# Patient Record
Sex: Female | Born: 1955 | Race: Asian | Hispanic: No | Marital: Married | Smoking: Never smoker
Health system: Southern US, Community
[De-identification: ages and names within clinical notes are randomized; demographics above are authoritative.]

## PROBLEM LIST (undated history)

## (undated) DIAGNOSIS — R87810 Cervical high risk human papillomavirus (HPV) DNA test positive: Secondary | ICD-10-CM

## (undated) DIAGNOSIS — H409 Unspecified glaucoma: Secondary | ICD-10-CM

## (undated) HISTORY — DX: Unspecified glaucoma: H40.9

## (undated) HISTORY — DX: Cervical high risk human papillomavirus (HPV) DNA test positive: R87.810

---

## 2004-04-12 HISTORY — PX: OVARIAN CYST REMOVAL: SHX89

## 2014-11-20 ENCOUNTER — Other Ambulatory Visit (HOSPITAL_COMMUNITY): Payer: Self-pay | Admitting: Physician Assistant

## 2014-11-20 DIAGNOSIS — Z139 Encounter for screening, unspecified: Secondary | ICD-10-CM

## 2014-11-22 HISTORY — PX: GLAUCOMA SURGERY: SHX656

## 2014-11-27 ENCOUNTER — Ambulatory Visit (HOSPITAL_COMMUNITY): Payer: Self-pay

## 2014-12-31 ENCOUNTER — Encounter: Payer: Self-pay | Admitting: *Deleted

## 2015-01-13 ENCOUNTER — Other Ambulatory Visit (HOSPITAL_COMMUNITY)
Admission: RE | Admit: 2015-01-13 | Discharge: 2015-01-13 | Disposition: A | Discharge status: Home / Self Care | Payer: 59 | Source: Ambulatory Visit | Attending: Adult Health | Admitting: Adult Health

## 2015-01-13 ENCOUNTER — Ambulatory Visit (INDEPENDENT_AMBULATORY_CARE_PROVIDER_SITE_OTHER): Payer: 59 | Admitting: Adult Health

## 2015-01-13 ENCOUNTER — Encounter: Payer: Self-pay | Admitting: Adult Health

## 2015-01-13 VITALS — BP 140/90 | HR 80 | Ht 61.75 in | Wt 127.0 lb

## 2015-01-13 DIAGNOSIS — Z01419 Encounter for gynecological examination (general) (routine) without abnormal findings: Secondary | ICD-10-CM | POA: Diagnosis not present

## 2015-01-13 DIAGNOSIS — Z1151 Encounter for screening for human papillomavirus (HPV): Secondary | ICD-10-CM | POA: Diagnosis present

## 2015-01-13 DIAGNOSIS — Z1212 Encounter for screening for malignant neoplasm of rectum: Secondary | ICD-10-CM

## 2015-01-13 LAB — HEMOCCULT GUIAC POC 1CARD (OFFICE): FECAL OCCULT BLD: NEGATIVE

## 2015-01-13 NOTE — Progress Notes (Signed)
Patient ID: Kathleen White, female   DOB: 02/20/1956, 59 y.o.   MRN: [id number] History of Present Illness:  Kathleen White is a 59 year old Asian female, married in for a well woman gyn exam and pap.She is new pt. PCP is [facility]. 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  Current Medications, Allergies, Past Medical History, Past Surgical History, Family History and Social History were reviewed in [employer] record.     Review of Systems: Patient denies any  Daily headaches(has some headaches, has glaucoma), hearing loss, fatigue, blurred vision, shortness of breath, chest pain, abdominal pain, problems with bowel movements, urination, or intercourse(dry at times). No joint pain or mood swings.    Physical Exam:BP 140/90 mmHg  Pulse 80  Ht 5' 1.75" (1.568 m)  Wt 127 lb (57.607 kg)  BMI 23.43 kg/m2 General:  Well developed, well nourished, no acute distress Skin:  Warm and dry Neck:  Midline trachea, normal thyroid, good ROM, no lymphadenopathy Lungs; Clear to auscultation bilaterally Breast:  No dominant palpable mass, retraction, or nipple discharge Cardiovascular: Regular rate and rhythm Abdomen:  Soft, non tender, no hepatosplenomegaly Pelvic:  External genitalia is normal in appearance, no lesions.  The vagina has decreased color, moisture and rugae. Urethra has no lesions or masses. The cervix is atrophic, pap with HPV performed.  Uterus is felt to be normal size, shape, and contour.  No adnexal masses or tenderness noted.Bladder is non tender, no masses felt. Rectal: Good sphincter tone, no polyps, or hemorrhoids felt.  Hemoccult negative. Extremities/musculoskeletal:  No swelling or varicosities noted, no clubbing or cyanosis Psych:  No mood changes, alert and cooperative,seems happy   Impression: Well woman gyn exam and pap    Plan: Mammogram scheduled for her 10/17 at 7:20 am at [facility] Physical in 1  year Pap in 3 if normal Colonoscopy per GI Labs with PCP Use astroglide with sex

## 2015-01-13 NOTE — Patient Instructions (Addendum)
Physical in  1 year Pap in 3 years if normal with negative HPV Mammogram yearly  10/17 at 7:20 am at [facility] with PCP Colonoscopy per GI

## 2015-01-14 LAB — CYTOLOGY - PAP

## 2015-01-15 ENCOUNTER — Telehealth: Payer: Self-pay | Admitting: Adult Health

## 2015-01-15 NOTE — Telephone Encounter (Signed)
Left message to call about pap

## 2015-01-20 ENCOUNTER — Telehealth: Payer: Self-pay | Admitting: Adult Health

## 2015-01-20 ENCOUNTER — Encounter: Payer: Self-pay | Admitting: Adult Health

## 2015-01-20 DIAGNOSIS — R87810 Cervical high risk human papillomavirus (HPV) DNA test positive: Secondary | ICD-10-CM

## 2015-01-20 HISTORY — DX: Cervical high risk human papillomavirus (HPV) DNA test positive: R87.810

## 2015-01-20 NOTE — Telephone Encounter (Signed)
Left message to call about pap

## 2015-01-22 ENCOUNTER — Ambulatory Visit (HOSPITAL_COMMUNITY): Payer: Self-pay

## 2015-01-23 ENCOUNTER — Encounter: Payer: Self-pay | Admitting: Adult Health

## 2015-01-27 ENCOUNTER — Ambulatory Visit (HOSPITAL_COMMUNITY): Payer: Self-pay

## 2015-01-27 ENCOUNTER — Other Ambulatory Visit (HOSPITAL_COMMUNITY): Payer: Self-pay | Admitting: Physician Assistant

## 2015-01-27 ENCOUNTER — Ambulatory Visit (HOSPITAL_COMMUNITY)
Admission: RE | Admit: 2015-01-27 | Discharge: 2015-01-27 | Disposition: A | Discharge status: Home / Self Care | Payer: 59 | Source: Ambulatory Visit | Attending: Physician Assistant | Admitting: Physician Assistant

## 2015-01-27 DIAGNOSIS — Z1231 Encounter for screening mammogram for malignant neoplasm of breast: Secondary | ICD-10-CM | POA: Insufficient documentation

## 2016-03-22 ENCOUNTER — Ambulatory Visit (INDEPENDENT_AMBULATORY_CARE_PROVIDER_SITE_OTHER): Payer: BLUE CROSS/BLUE SHIELD | Admitting: Otolaryngology

## 2016-03-22 DIAGNOSIS — J351 Hypertrophy of tonsils: Secondary | ICD-10-CM

## 2016-03-22 DIAGNOSIS — J3501 Chronic tonsillitis: Secondary | ICD-10-CM | POA: Diagnosis not present

## 2020-03-05 ENCOUNTER — Other Ambulatory Visit (HOSPITAL_COMMUNITY): Payer: Self-pay | Admitting: Physician Assistant

## 2020-03-05 DIAGNOSIS — Z1231 Encounter for screening mammogram for malignant neoplasm of breast: Secondary | ICD-10-CM

## 2020-03-05 DIAGNOSIS — E2839 Other primary ovarian failure: Secondary | ICD-10-CM

## 2020-07-07 ENCOUNTER — Ambulatory Visit (HOSPITAL_COMMUNITY)
Admission: RE | Admit: 2020-07-07 | Discharge: 2020-07-07 | Disposition: A | Discharge status: Home / Self Care | Payer: 59 | Source: Ambulatory Visit | Attending: Physician Assistant | Admitting: Physician Assistant

## 2020-07-07 DIAGNOSIS — E2839 Other primary ovarian failure: Secondary | ICD-10-CM

## 2020-07-07 DIAGNOSIS — Z1231 Encounter for screening mammogram for malignant neoplasm of breast: Secondary | ICD-10-CM | POA: Insufficient documentation

## 2020-11-06 ENCOUNTER — Encounter (INDEPENDENT_AMBULATORY_CARE_PROVIDER_SITE_OTHER): Payer: Self-pay | Admitting: *Deleted

## 2020-12-23 ENCOUNTER — Other Ambulatory Visit (INDEPENDENT_AMBULATORY_CARE_PROVIDER_SITE_OTHER): Payer: Self-pay

## 2020-12-23 ENCOUNTER — Encounter (INDEPENDENT_AMBULATORY_CARE_PROVIDER_SITE_OTHER): Payer: Self-pay

## 2020-12-23 ENCOUNTER — Telehealth (INDEPENDENT_AMBULATORY_CARE_PROVIDER_SITE_OTHER): Payer: Self-pay

## 2020-12-23 ENCOUNTER — Encounter (INDEPENDENT_AMBULATORY_CARE_PROVIDER_SITE_OTHER): Payer: Self-pay | Admitting: *Deleted

## 2020-12-23 DIAGNOSIS — Z1211 Encounter for screening for malignant neoplasm of colon: Secondary | ICD-10-CM

## 2020-12-23 MED ORDER — PEG 3350-KCL-NA BICARB-NACL 420 G PO SOLR: 4000.0000 mL | Freq: Once | ORAL | 0 refills | Status: AC

## 2020-12-23 NOTE — Telephone Encounter (Signed)
Referring MD/PCP: Collene Mares  Procedure: Screening Tcs  Reason/Indication:  Screening  Has patient had this procedure before?  Yes, 2009  If so, when, by whom and where?    Is there a family history of colon cancer?  No  Who?  What age when diagnosed?    Is patient diabetic? If yes, Type 1 or Type 2   No      Does patient have prosthetic heart valve or mechanical valve?  No  Do you have a pacemaker/defibrillator?  No  Has patient ever had endocarditis/atrial fibrillation? No  Does patient use oxygen? No  Has patient had joint replacement within last 12 months?  No  Is patient constipated or do they take laxatives? No  Does patient have a history of alcohol/drug use?  No  Have you had a stroke/heart attack last 6 mths? No  Do you take medicine for weight loss?  No  For female patients,: do you still have your menstrual cycle? No  Is patient on blood thinner such as Coumadin, Plavix and/or Aspirin? No  Medications: Otc eye drops   Allergies: Nkda  Medication Adjustment per Dr Rehman/Dr Jenetta Downer None  Procedure date & time: 02/04/2021 at 9:15 am

## 2020-12-23 NOTE — Telephone Encounter (Signed)
Donette Larry, CMA

## 2021-01-08 NOTE — Telephone Encounter (Signed)
Ok to schedule.  Thanks,  Sharmeka Palmisano Castaneda Mayorga, MD Gastroenterology and Hepatology [facility]  

## 2021-02-04 ENCOUNTER — Ambulatory Visit (HOSPITAL_COMMUNITY): Payer: 59 | Admitting: Anesthesiology

## 2021-02-04 ENCOUNTER — Ambulatory Visit (HOSPITAL_COMMUNITY)
Admission: RE | Admit: 2021-02-04 | Discharge: 2021-02-04 | Disposition: A | Discharge status: Home / Self Care | Payer: 59 | Attending: Gastroenterology | Admitting: Gastroenterology

## 2021-02-04 ENCOUNTER — Encounter (HOSPITAL_COMMUNITY)
Admission: RE | Disposition: A | Discharge status: Home / Self Care | Payer: Self-pay | Source: Home / Self Care | Attending: Gastroenterology

## 2021-02-04 ENCOUNTER — Encounter (HOSPITAL_COMMUNITY): Payer: Self-pay | Admitting: Gastroenterology

## 2021-02-04 ENCOUNTER — Other Ambulatory Visit: Payer: Self-pay

## 2021-02-04 DIAGNOSIS — D122 Benign neoplasm of ascending colon: Secondary | ICD-10-CM | POA: Insufficient documentation

## 2021-02-04 DIAGNOSIS — K5730 Diverticulosis of large intestine without perforation or abscess without bleeding: Secondary | ICD-10-CM | POA: Insufficient documentation

## 2021-02-04 DIAGNOSIS — Z1211 Encounter for screening for malignant neoplasm of colon: Secondary | ICD-10-CM | POA: Insufficient documentation

## 2021-02-04 HISTORY — PX: COLONOSCOPY WITH PROPOFOL: SHX5780

## 2021-02-04 HISTORY — PX: POLYPECTOMY: SHX5525

## 2021-02-04 LAB — HM COLONOSCOPY

## 2021-02-04 SURGERY — COLONOSCOPY WITH PROPOFOL
Anesthesia: General

## 2021-02-04 MED ORDER — PHENYLEPHRINE 40 MCG/ML (10ML) SYRINGE FOR IV PUSH (FOR BLOOD PRESSURE SUPPORT)
PREFILLED_SYRINGE | INTRAVENOUS | Status: DC | PRN
  Administered 2021-02-04: 40 ug via INTRAVENOUS
  Administered 2021-02-04: 80 ug via INTRAVENOUS

## 2021-02-04 MED ORDER — PHENYLEPHRINE 40 MCG/ML (10ML) SYRINGE FOR IV PUSH (FOR BLOOD PRESSURE SUPPORT)
PREFILLED_SYRINGE | INTRAVENOUS | Status: AC
  Filled 2021-02-04: qty 10

## 2021-02-04 MED ORDER — LIDOCAINE HCL (CARDIAC) PF 100 MG/5ML IV SOSY
PREFILLED_SYRINGE | INTRAVENOUS | Status: DC | PRN
Start: 2021-02-04 — End: 2021-02-04
  Administered 2021-02-04: 50 mg via INTRAVENOUS

## 2021-02-04 MED ORDER — PROPOFOL 500 MG/50ML IV EMUL
INTRAVENOUS | Status: DC | PRN
  Administered 2021-02-04: 150 ug/kg/min via INTRAVENOUS

## 2021-02-04 MED ORDER — PROPOFOL 10 MG/ML IV BOLUS
INTRAVENOUS | Status: DC | PRN
  Administered 2021-02-04: 100 mg via INTRAVENOUS

## 2021-02-04 MED ORDER — LACTATED RINGERS IV SOLN: INTRAVENOUS | Status: DC

## 2021-02-04 MED ORDER — STERILE WATER FOR IRRIGATION IR SOLN
Status: DC | PRN
  Administered 2021-02-04: 60 mL

## 2021-02-04 NOTE — Anesthesia Preprocedure Evaluation (Signed)
Anesthesia Evaluation  Patient identified by MRN, date of birth, ID band Patient awake    Reviewed: Allergy & Precautions, NPO status , Patient's Chart, lab work & pertinent test results  Airway Mallampati: II  TM Distance: >3 FB Neck ROM: Full    Dental  (+) Dental Advisory Given, Missing   Pulmonary neg pulmonary ROS,    Pulmonary exam normal breath sounds clear to auscultation       Cardiovascular negative cardio ROS Normal cardiovascular exam Rhythm:Regular Rate:Normal     Neuro/Psych negative neurological ROS  negative psych ROS   GI/Hepatic negative GI ROS, Neg liver ROS,   Endo/Other  negative endocrine ROS  Renal/GU negative Renal ROS  negative genitourinary   Musculoskeletal negative musculoskeletal ROS (+)   Abdominal   Peds negative pediatric ROS (+)  Hematology negative hematology ROS (+)   Anesthesia Other Findings   Reproductive/Obstetrics negative OB ROS                             Anesthesia Physical Anesthesia Plan  ASA: 1  Anesthesia Plan: General   Post-op Pain Management:    Induction: Intravenous  PONV Risk Score and Plan: TIVA  Airway Management Planned: Nasal Cannula and Natural Airway  Additional Equipment:   Intra-op Plan:   Post-operative Plan:   Informed Consent: I have reviewed the patients History and Physical, chart, labs and discussed the procedure including the risks, benefits and alternatives for the proposed anesthesia with the patient or authorized representative who has indicated his/her understanding and acceptance.     Dental advisory given  Plan Discussed with: CRNA and Surgeon  Anesthesia Plan Comments:         Anesthesia Quick Evaluation

## 2021-02-04 NOTE — H&P (Signed)
Kathleen White is an 65 y.o. female.   Chief Complaint: Screening for colon cancer HPI: 65 year old female with past medical history of glaucoma, coming for screening colonoscopy.  Last colonoscopy was performed 10 years ago per the patient, she reports it was normal, no report is available.  The patient denies having any complaints such as melena, hematochezia, abdominal pain or distention, change in her bowel movement consistency or frequency, no changes in her weight recently.  No family history of colorectal cancer.   Past Medical History:  Diagnosis Date   Glaucoma    Papanicolaou smear of cervix with positive high risk human papilloma virus (HPV) test 01/20/2015    Past Surgical History:  Procedure Laterality Date   GLAUCOMA SURGERY  11/22/14   OVARIAN CYST REMOVAL  2006    Family History  Problem Relation Age of Onset   Hypertension Mother    Heart attack Mother    Heart attack Maternal Aunt    Social History:  reports that she has never smoked. She has never used smokeless tobacco. She reports that she does not drink alcohol and does not use drugs.  Allergies: No Known Allergies  Medications Prior to Admission  Medication Sig Dispense Refill   acetaminophen (TYLENOL) 500 MG tablet Take 1,000 mg by mouth daily as needed for headache.     calcium carbonate (OS-CAL) 600 MG TABS tablet Take 600 mg by mouth daily with breakfast.     latanoprost (XALATAN) 0.005 % ophthalmic solution Place 1 drop into both eyes at bedtime.     Omega-3 1400 MG CAPS Take 1,400 mg by mouth daily.      No results found for this or any previous visit (from the past 48 hour(s)). No results found.  Review of Systems  Constitutional: Negative.   HENT: Negative.    Eyes: Negative.   Respiratory: Negative.    Cardiovascular: Negative.   Gastrointestinal: Negative.   Endocrine: Negative.   Genitourinary: Negative.   Musculoskeletal: Negative.   Skin: Negative.   Allergic/Immunologic: Negative.    Neurological: Negative.   Hematological: Negative.   Psychiatric/Behavioral: Negative.     Blood pressure 123/70, pulse 99, temperature 98.2 F (36.8 C), temperature source Oral, resp. rate 17, height 5\' 2"  (1.575 m), weight 59 kg, SpO2 99 %. Physical Exam  GENERAL: The patient is AO x3, in no acute distress. HEENT: Head is normocephalic and atraumatic. EOMI are intact. Mouth is well hydrated and without lesions. NECK: Supple. No masses LUNGS: Clear to auscultation. No presence of rhonchi/wheezing/rales. Adequate chest expansion HEART: RRR, normal s1 and s2. ABDOMEN: Soft, nontender, no guarding, no peritoneal signs, and nondistended. BS +. No masses. EXTREMITIES: Without any cyanosis, clubbing, rash, lesions or edema. NEUROLOGIC: AOx3, no focal motor deficit. SKIN: no jaundice, no rashes  Assessment/Plan 65 year old female with past medical history of glaucoma, coming for screening colonoscopy. The patient is at average risk for colorectal cancer.  We will proceed with colonoscopy today.   Harvel Quale, MD 02/04/2021, 8:59 AM

## 2021-02-04 NOTE — Transfer of Care (Signed)
Immediate Anesthesia Transfer of Care Note  Patient: Kathleen White  Procedure(s) Performed: COLONOSCOPY WITH PROPOFOL POLYPECTOMY  Patient Location: Endoscopy Unit  Anesthesia Type:General  Level of Consciousness: awake and oriented  Airway & Oxygen Therapy: Patient Spontanous Breathing  Post-op Assessment: Report given to RN, Post -op Vital signs reviewed and stable and Patient moving all extremities X 4  Post vital signs: Reviewed and stable  Last Vitals:  Vitals Value Taken Time  BP    Temp    Pulse    Resp    SpO2      Last Pain:  Vitals:   02/04/21 0901  TempSrc:   PainSc: 0-No pain      Patients Stated Pain Goal: 7 (20/25/42 7062)  Complications: No notable events documented.

## 2021-02-04 NOTE — Anesthesia Procedure Notes (Signed)
Date/Time: 02/04/2021 9:12 AM Performed by: Orlie Dakin, CRNA Pre-anesthesia Checklist: Patient identified, Emergency Drugs available, Suction available and Patient being monitored Patient Re-evaluated:Patient Re-evaluated prior to induction Oxygen Delivery Method: Nasal cannula Induction Type: IV induction Placement Confirmation: positive ETCO2

## 2021-02-04 NOTE — Op Note (Signed)
[facility] Patient Name: Kathleen White Procedure Date: 02/04/2021 8:56 AM MRN: [id number] Date of Birth: Aug 22, 1955 Attending MD: Maylon Peppers ,  CSN: [id number] Age: 65 Admit Type: Outpatient Procedure:                Colonoscopy Indications:              Screening for colorectal malignant neoplasm Providers:                Maylon Peppers, Lambert Mody, [place]                            Risa Grill, Technician Referring MD:              Medicines:                Monitored Anesthesia Care Complications:            No immediate complications. Estimated Blood Loss:     Estimated blood loss: none. Procedure:                Pre-Anesthesia Assessment:                           - Prior to the procedure, a History and Physical                            was performed, and patient medications, allergies                            and sensitivities were reviewed. The patient's                            tolerance of previous anesthesia was reviewed.                           - The risks and benefits of the procedure and the                            sedation options and risks were discussed with the                            patient. All questions were answered and informed                            consent was obtained.                           - ASA Grade Assessment: II - A patient with mild                            systemic disease.                           After obtaining informed consent, the colonoscope                            was passed under direct vision. Throughout the  procedure, the patient's blood pressure, pulse, and                            oxygen saturations were monitored continuously. The                            [id number]-HQ190L ([id number]) scope was introduced through                            the anus and advanced to the the cecum, identified                            by appendiceal orifice and ileocecal valve. The                             colonoscopy was performed without difficulty. The                            patient tolerated the procedure well. The quality                            of the bowel preparation was excellent. Scope In: 9:05:25 AM Scope Out: 9:26:36 AM Scope Withdrawal Time: 0 hours 16 minutes 33 seconds  Total Procedure Duration: 0 hours 21 minutes 11 seconds  Findings:      The perianal and digital rectal examinations were normal.      Three sessile polyps were found in the ascending colon. The polyps were       1 to 6 mm in size. These polyps were removed with a cold snare.       Resection and retrieval were complete.      A single large-mouthed diverticulum was found in the cecum.      The retroflexed view of the distal rectum and anal verge was normal and       showed no anal or rectal abnormalities. Impression:               - Three 1 to 6 mm polyps in the ascending colon,                            removed with a cold snare. Resected and retrieved.                           - Diverticulosis in the cecum.                           - The distal rectum and anal verge are normal on                            retroflexion view. Moderate Sedation:      Per Anesthesia Care Recommendation:           - Discharge patient to home (ambulatory).                           - Resume previous diet.                           -  Await pathology results.                           - Repeat colonoscopy for surveillance based on                            pathology results. Procedure Code(s):        --- Professional ---                           [phone], Colonoscopy, flexible; with removal of                            tumor(s), polyp(s), or other lesion(s) by snare                            technique Diagnosis Code(s):        --- Professional ---                           Z12.11, Encounter for screening for malignant                            neoplasm of colon                           K63.5,  Polyp of colon                           K57.30, Diverticulosis of large intestine without                            perforation or abscess without bleeding CPT copyright 2019 American Medical Association. All rights reserved. The codes documented in this report are preliminary and upon coder review may  be revised to meet current compliance requirements. Maylon Peppers, MD Maylon Peppers,  02/04/2021 9:31:29 AM This report has been signed electronically. Number of Addenda: 0

## 2021-02-04 NOTE — Discharge Instructions (Signed)
You are being discharged to home.  Resume your previous diet.  We are waiting for your pathology results.  Your physician has recommended a repeat colonoscopy for surveillance based on pathology results.  

## 2021-02-04 NOTE — Anesthesia Postprocedure Evaluation (Signed)
Anesthesia Post Note  Patient: Kathleen White  Procedure(s) Performed: COLONOSCOPY WITH PROPOFOL POLYPECTOMY  Patient location during evaluation: Endoscopy Anesthesia Type: General Level of consciousness: awake and alert and oriented Pain management: pain level controlled Vital Signs Assessment: post-procedure vital signs reviewed and stable Respiratory status: spontaneous breathing, nonlabored ventilation and respiratory function stable Cardiovascular status: blood pressure returned to baseline and stable Postop Assessment: no apparent nausea or vomiting Anesthetic complications: no   No notable events documented.   Last Vitals:  Vitals:   02/04/21 0758 02/04/21 0929  BP: 123/70 101/65  Pulse: 99 84  Resp: 17 (!) 21  Temp: 36.8 C 36.6 C  SpO2: 99% 98%    Last Pain:  Vitals:   02/04/21 0929  TempSrc: Oral  PainSc: 0-No pain                 [name] C [name]

## 2021-02-05 LAB — SURGICAL PATHOLOGY

## 2021-02-06 ENCOUNTER — Encounter (HOSPITAL_COMMUNITY): Payer: Self-pay | Admitting: Gastroenterology

## 2021-02-09 ENCOUNTER — Encounter (INDEPENDENT_AMBULATORY_CARE_PROVIDER_SITE_OTHER): Payer: Self-pay | Admitting: *Deleted

## 2021-05-29 ENCOUNTER — Other Ambulatory Visit (HOSPITAL_COMMUNITY): Payer: Self-pay | Admitting: Physical Therapy

## 2021-05-29 DIAGNOSIS — Z1231 Encounter for screening mammogram for malignant neoplasm of breast: Secondary | ICD-10-CM

## 2021-07-09 ENCOUNTER — Other Ambulatory Visit (HOSPITAL_COMMUNITY): Payer: Self-pay | Admitting: Physician Assistant

## 2021-07-09 ENCOUNTER — Ambulatory Visit (HOSPITAL_COMMUNITY)
Admission: RE | Admit: 2021-07-09 | Discharge: 2021-07-09 | Disposition: A | Discharge status: Home / Self Care | Payer: 59 | Source: Ambulatory Visit | Attending: Physical Therapy | Admitting: Physical Therapy

## 2021-07-09 DIAGNOSIS — Z1231 Encounter for screening mammogram for malignant neoplasm of breast: Secondary | ICD-10-CM

## 2021-07-09 DIAGNOSIS — Z0000 Encounter for general adult medical examination without abnormal findings: Secondary | ICD-10-CM | POA: Diagnosis not present

## 2021-09-24 DIAGNOSIS — H402213 Chronic angle-closure glaucoma, right eye, severe stage: Secondary | ICD-10-CM | POA: Diagnosis not present

## 2021-09-24 DIAGNOSIS — H402224 Chronic angle-closure glaucoma, left eye, indeterminate stage: Secondary | ICD-10-CM | POA: Diagnosis not present

## 2022-06-01 ENCOUNTER — Other Ambulatory Visit (HOSPITAL_COMMUNITY): Payer: Self-pay | Admitting: Family Medicine

## 2022-06-01 DIAGNOSIS — Z1231 Encounter for screening mammogram for malignant neoplasm of breast: Secondary | ICD-10-CM

## 2022-07-12 ENCOUNTER — Ambulatory Visit (HOSPITAL_COMMUNITY)
Admission: RE | Admit: 2022-07-12 | Discharge: 2022-07-12 | Disposition: A | Discharge status: Home / Self Care | Payer: Medicare Other | Source: Ambulatory Visit | Attending: Family Medicine | Admitting: Family Medicine

## 2022-07-12 DIAGNOSIS — Z1231 Encounter for screening mammogram for malignant neoplasm of breast: Secondary | ICD-10-CM | POA: Insufficient documentation

## 2022-11-18 DIAGNOSIS — E782 Mixed hyperlipidemia: Secondary | ICD-10-CM | POA: Diagnosis not present

## 2022-11-18 DIAGNOSIS — Z6822 Body mass index (BMI) 22.0-22.9, adult: Secondary | ICD-10-CM | POA: Diagnosis not present

## 2023-01-06 DIAGNOSIS — H402224 Chronic angle-closure glaucoma, left eye, indeterminate stage: Secondary | ICD-10-CM | POA: Diagnosis not present

## 2023-01-06 DIAGNOSIS — H402213 Chronic angle-closure glaucoma, right eye, severe stage: Secondary | ICD-10-CM | POA: Diagnosis not present

## 2023-06-13 ENCOUNTER — Other Ambulatory Visit (HOSPITAL_COMMUNITY): Payer: Self-pay | Admitting: Family Medicine

## 2023-06-13 DIAGNOSIS — Z1231 Encounter for screening mammogram for malignant neoplasm of breast: Secondary | ICD-10-CM

## 2023-07-07 DIAGNOSIS — H402213 Chronic angle-closure glaucoma, right eye, severe stage: Secondary | ICD-10-CM | POA: Diagnosis not present

## 2023-07-07 DIAGNOSIS — H25812 Combined forms of age-related cataract, left eye: Secondary | ICD-10-CM | POA: Diagnosis not present

## 2023-07-18 ENCOUNTER — Ambulatory Visit (HOSPITAL_COMMUNITY)
Admission: RE | Admit: 2023-07-18 | Discharge: 2023-07-18 | Disposition: A | Discharge status: Home / Self Care | Source: Ambulatory Visit | Attending: Family Medicine | Admitting: Family Medicine

## 2023-07-18 DIAGNOSIS — Z1231 Encounter for screening mammogram for malignant neoplasm of breast: Secondary | ICD-10-CM | POA: Diagnosis not present

## 2023-08-25 DIAGNOSIS — Z0000 Encounter for general adult medical examination without abnormal findings: Secondary | ICD-10-CM | POA: Diagnosis not present

## 2023-08-25 DIAGNOSIS — E782 Mixed hyperlipidemia: Secondary | ICD-10-CM | POA: Diagnosis not present

## 2023-08-25 DIAGNOSIS — Z0001 Encounter for general adult medical examination with abnormal findings: Secondary | ICD-10-CM | POA: Diagnosis not present

## 2023-08-25 DIAGNOSIS — Z6822 Body mass index (BMI) 22.0-22.9, adult: Secondary | ICD-10-CM | POA: Diagnosis not present

## 2023-11-25 IMAGING — MG MM DIGITAL SCREENING BILAT W/ TOMO AND CAD
8 series · 9 of 24 positions shown · non-contrast
Comparison: Previous exam(s).

CLINICAL DATA: Screening.

EXAM:
DIGITAL SCREENING BILATERAL MAMMOGRAM WITH TOMOSYNTHESIS AND CAD
TECHNIQUE: Bilateral screening digital craniocaudal and mediolateral oblique
mammograms were obtained. Bilateral screening digital breast
tomosynthesis was performed. The images were evaluated with
computer-aided detection.

[R MLO synth-2D]
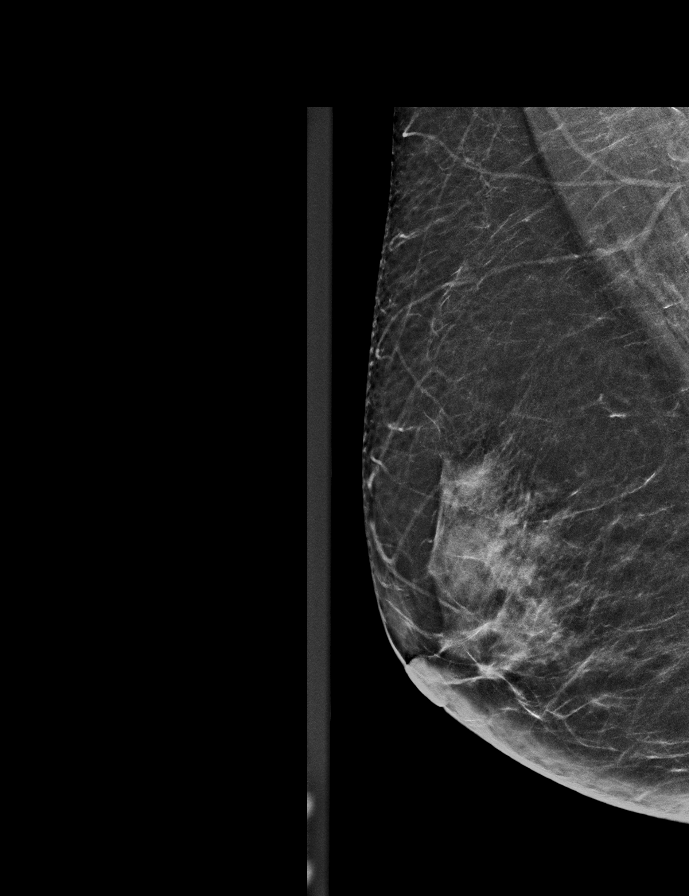

[L CC synth-2D]
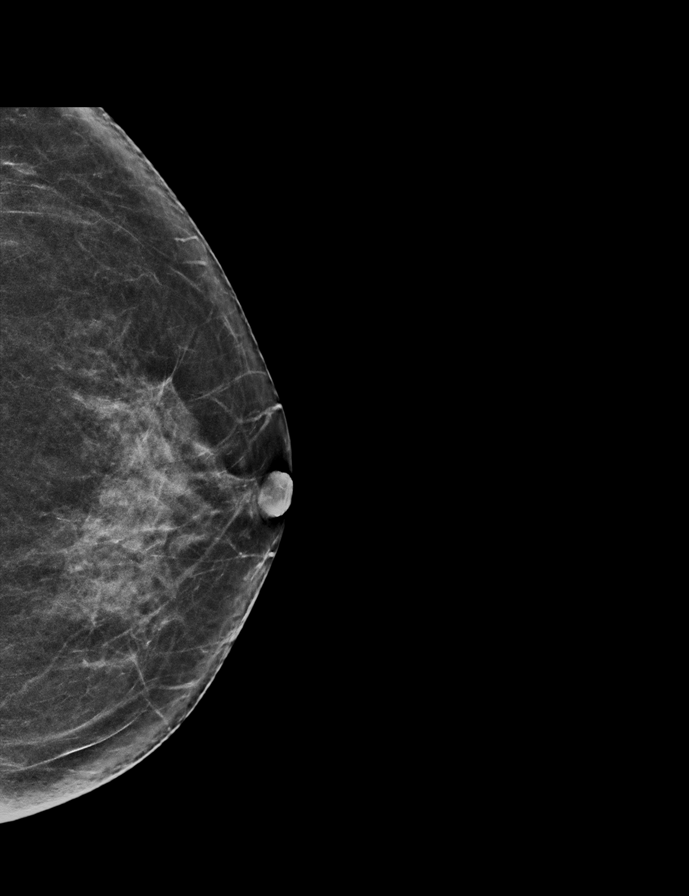

[R CC synth-2D]
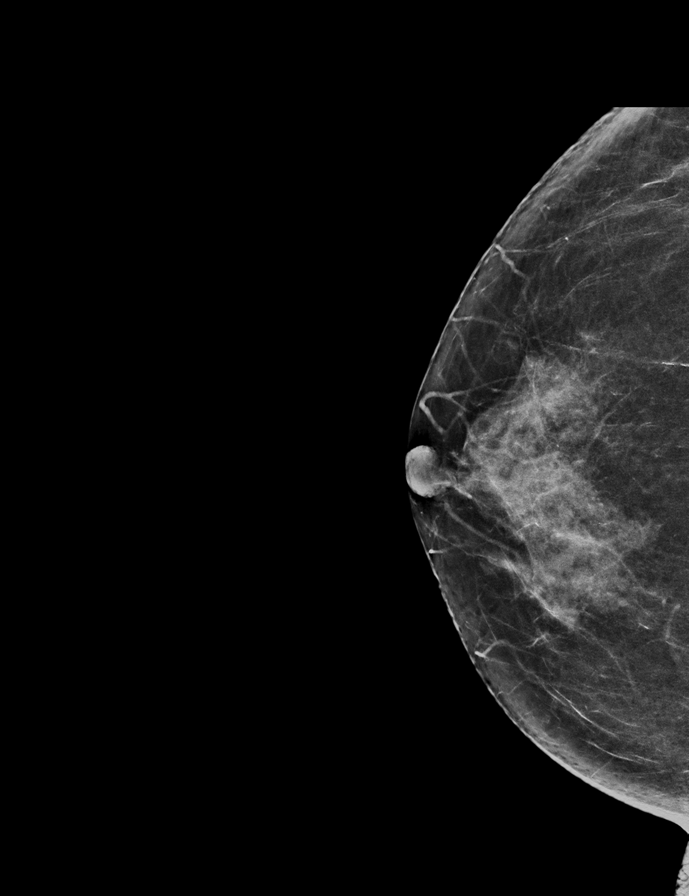

[L MLO synth-2D]
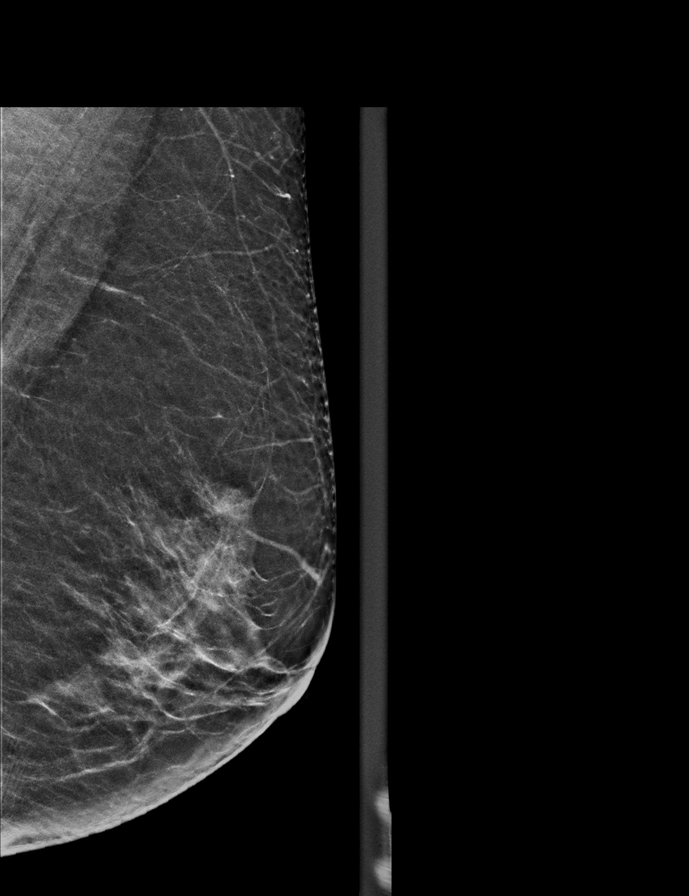

[R CC tomo · 2 of 62 frames shown]
[frame 21/62]
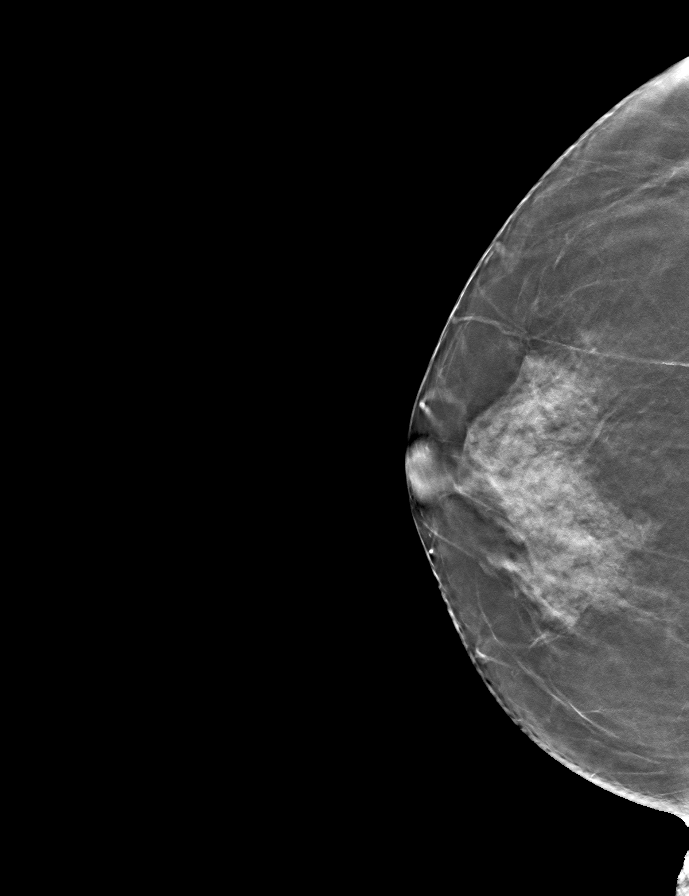
[frame 31/62]
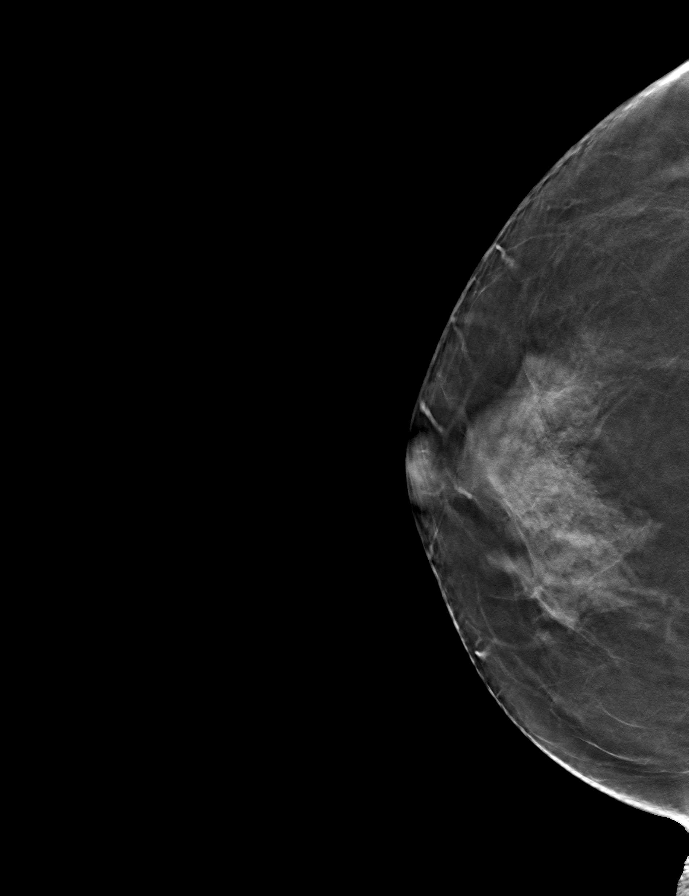

[L MLO tomo · tomo slice 31/62.0]
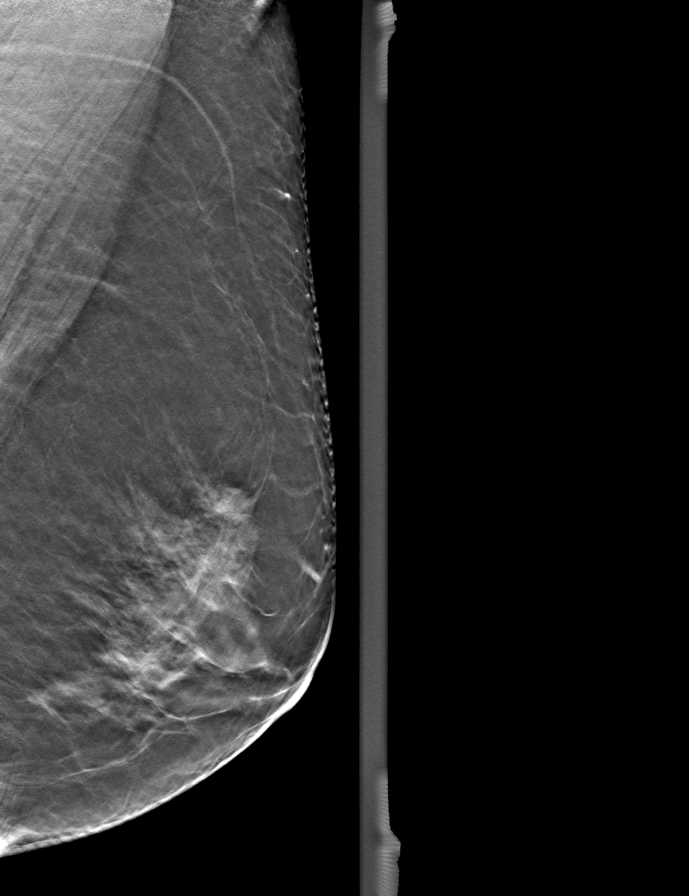

[L CC tomo · tomo slice 31/62.0]
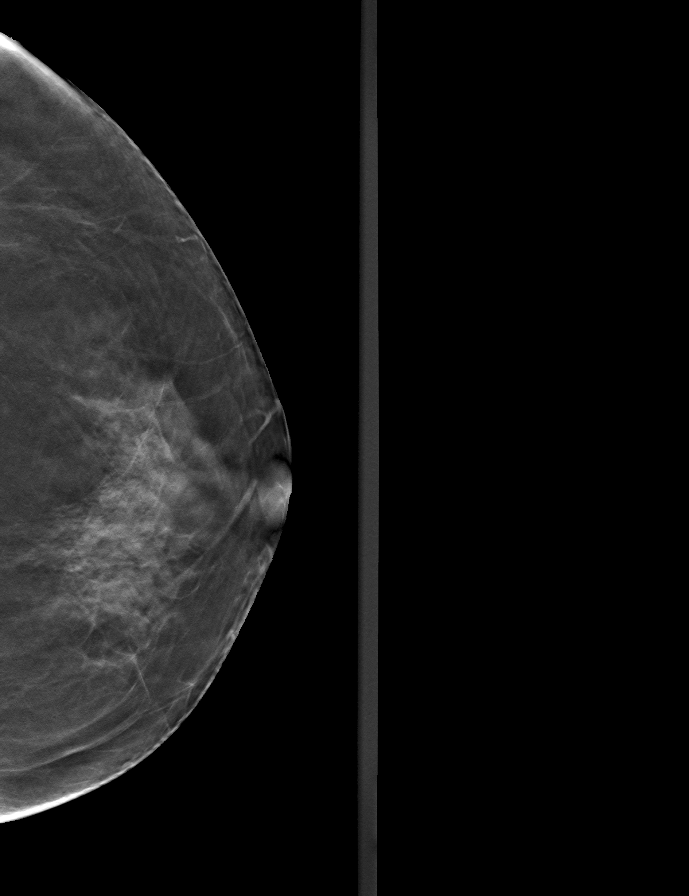

[R MLO tomo · tomo slice 31/62.0]
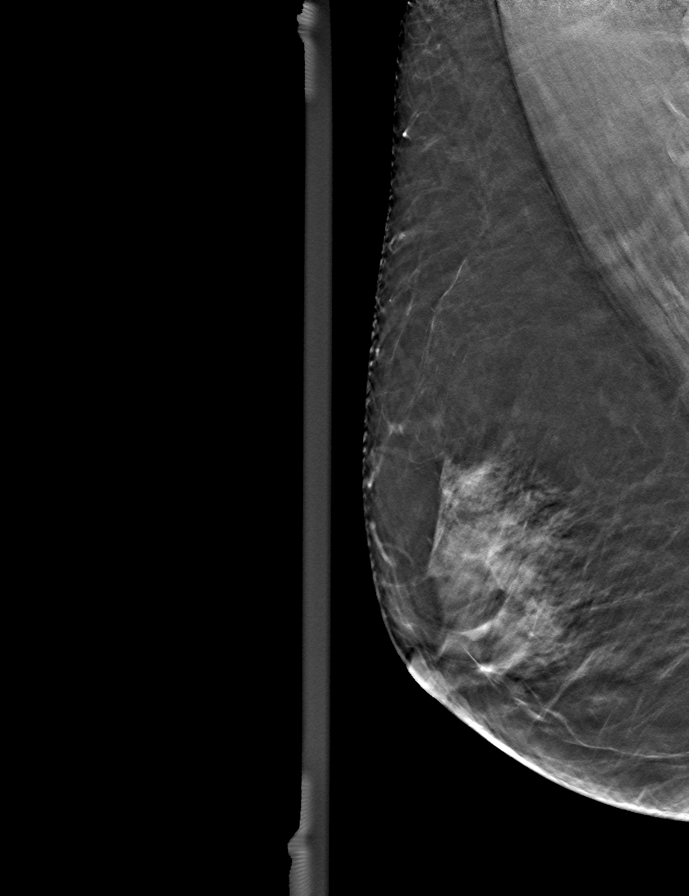

[9 of 24 positions shown; findings below may reference images not displayed]

ACR Breast Density Category c: The breast tissue is heterogeneously
dense, which may obscure small masses.
FINDINGS: There are no findings suspicious for malignancy.
IMPRESSION: No mammographic evidence of malignancy. A result letter of this
screening mammogram will be mailed directly to the patient.

RECOMMENDATION:
Screening mammogram in one year. (Code:Q3-W-BC3)

BI-RADS CATEGORY  1: Negative.
